# Patient Record
Sex: Male | Born: 2000 | Race: Black or African American | Hispanic: No | Marital: Single | Smoking: Never smoker
Health system: Southern US, Community
[De-identification: ages and names within clinical notes are randomized; demographics above are authoritative.]

## PROBLEM LIST (undated history)

## (undated) HISTORY — PX: HERNIA REPAIR: SHX51

---
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
## 2018-02-09 ENCOUNTER — Encounter (HOSPITAL_COMMUNITY): Payer: Self-pay | Admitting: Emergency Medicine

## 2018-02-09 ENCOUNTER — Other Ambulatory Visit: Payer: Self-pay

## 2018-02-09 ENCOUNTER — Ambulatory Visit (HOSPITAL_COMMUNITY)
Admission: EM | Admit: 2018-02-09 | Discharge: 2018-02-09 | Disposition: A | Discharge status: Home / Self Care | Payer: 59 | Attending: Family Medicine | Admitting: Family Medicine

## 2018-02-09 DIAGNOSIS — R69 Illness, unspecified: Secondary | ICD-10-CM | POA: Diagnosis not present

## 2018-02-09 DIAGNOSIS — J40 Bronchitis, not specified as acute or chronic: Secondary | ICD-10-CM

## 2018-02-09 DIAGNOSIS — J111 Influenza due to unidentified influenza virus with other respiratory manifestations: Secondary | ICD-10-CM

## 2018-02-09 MED ORDER — BENZONATATE 100 MG PO CAPS
100.0000 mg | ORAL_CAPSULE | Freq: Three times a day (TID) | ORAL | 0 refills | Status: AC | PRN
Start: 2018-02-09 — End: ?

## 2018-02-09 MED ORDER — ALBUTEROL SULFATE HFA 108 (90 BASE) MCG/ACT IN AERS: 2.0000 | INHALATION_SPRAY | RESPIRATORY_TRACT | 1 refills | Status: AC | PRN

## 2018-02-09 NOTE — ED Provider Notes (Signed)
  [facility]   [id number] 02/09/18 Arrival Time: 1249   SUBJECTIVE:  Ronnie Murphy is a 17 y.o. male who presents to the urgent care with complaint of coughing, headache, back aches, chills, fatigue since Sunday.   History reviewed. No pertinent past medical history. No family history on file. Social History   Socioeconomic History  . Marital status: Single    Spouse name: Not on file  . Number of children: Not on file  . Years of education: Not on file  . Highest education level: Not on file  Social Needs  . Financial resource strain: Not on file  . Food insecurity - worry: Not on file  . Food insecurity - inability: Not on file  . Transportation needs - medical: Not on file  . Transportation needs - non-medical: Not on file  Occupational History  . Not on file  Tobacco Use  . Smoking status: Never Smoker  Substance and Sexual Activity  . Alcohol use: No  . Drug use: No  . Sexual activity: Not on file  Other Topics Concern  . Not on file  Social History Narrative  . Not on file   No outpatient medications have been marked as taking for the 02/09/18 encounter [facility](Hospital Encounter).   No Known Allergies    ROS: As per HPI, remainder of ROS negative.   OBJECTIVE:   Vitals:   02/09/18 1318  BP: 105/63  Pulse: 78  Resp: 18  Temp: 98.9 F (37.2 C)  SpO2: 100%     General appearance: alert; no distress Eyes: PERRL; EOMI; conjunctiva normal HENT: normocephalic; atraumatic; TMs normal, canal normal, external ears normal without trauma; nasal mucosa normal; oral mucosa normal Neck: supple Lungs: ronchi on auscultation bilaterally Heart: regular rate and rhythm Abdomen: soft, non-tender; bowel sounds normal; no masses or organomegaly; no guarding or rebound tenderness Back: no CVA tenderness Extremities: no cyanosis or edema; symmetrical with no gross deformities Skin: warm and dry Neurologic: normal gait; grossly normal Psychological: alert  and cooperative; normal mood and affect      Labs:  No results found for this or any previous visit.  Labs Reviewed - No data to display  No results found.     ASSESSMENT & PLAN:  1. Bronchitis   2. Influenza-like illness   Return if you are not feeling better by Sunday.  Meds ordered this encounter  Medications  . albuterol (PROVENTIL HFA;VENTOLIN HFA) 108 (90 Base) MCG/ACT inhaler    Sig: Inhale 2 puffs into the lungs every 4 (four) hours as needed for wheezing or shortness of breath (cough, shortness of breath or wheezing.).    Dispense:  1 Inhaler    Refill:  1  . benzonatate (TESSALON) 100 MG capsule    Sig: Take 1-2 capsules (100-200 mg total) by mouth 3 (three) times daily as needed for cough.    Dispense:  40 capsule    Refill:  0    Reviewed expectations re: course of current medical issues. Questions answered. Outlined signs and symptoms indicating need for more acute intervention. Patient verbalized understanding. After Visit Summary given.    Procedures:      Elvina SidleLauenstein, Shantale Holtmeyer, MD 02/09/18 [phone]

## 2018-02-09 NOTE — ED Triage Notes (Signed)
Pt c/o coughing, headache, back aches, chills, fatigue since Sunday.

## 2018-03-07 DIAGNOSIS — Z0000 Encounter for general adult medical examination without abnormal findings: Secondary | ICD-10-CM | POA: Diagnosis not present

## 2018-03-07 DIAGNOSIS — Z13220 Encounter for screening for lipoid disorders: Secondary | ICD-10-CM | POA: Diagnosis not present

## 2018-07-04 ENCOUNTER — Encounter (HOSPITAL_COMMUNITY): Payer: Self-pay | Admitting: Emergency Medicine

## 2018-07-04 ENCOUNTER — Ambulatory Visit (HOSPITAL_COMMUNITY)
Admission: EM | Admit: 2018-07-04 | Discharge: 2018-07-04 | Disposition: A | Discharge status: Home / Self Care | Payer: 59 | Attending: Internal Medicine | Admitting: Internal Medicine

## 2018-07-04 DIAGNOSIS — Z7251 High risk heterosexual behavior: Secondary | ICD-10-CM | POA: Insufficient documentation

## 2018-07-04 DIAGNOSIS — Z113 Encounter for screening for infections with a predominantly sexual mode of transmission: Secondary | ICD-10-CM | POA: Diagnosis not present

## 2018-07-04 NOTE — ED Triage Notes (Signed)
Pt wants to be checked for stds, no symptoms.
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
## 2018-07-04 NOTE — Discharge Instructions (Addendum)
Declines treatment today Urine cytology sent Declines HIV/ syphilis testing today We will follow up with you regarding the results of your test If tests are positive, please abstain from sexual activity for at least 7 days and notify partners Follow up with PCP if symptoms persists Return here or go to ER if you have any new or worsening symptoms

## 2018-07-05 ENCOUNTER — Telehealth (HOSPITAL_COMMUNITY): Payer: Self-pay

## 2018-07-05 LAB — URINE CYTOLOGY ANCILLARY ONLY
Chlamydia: POSITIVE — AB
NEISSERIA GONORRHEA: NEGATIVE
Trichomonas: NEGATIVE

## 2018-07-05 MED ORDER — AZITHROMYCIN 250 MG PO TABS: 1000.0000 mg | ORAL_TABLET | Freq: Every day | ORAL | 0 refills | Status: AC

## 2018-07-05 NOTE — Telephone Encounter (Signed)
Chlamydia is positive.  Rx po zithromax 1g #1 dose no refills was sent to the pharmacy of record.  Pt contacted and made aware, educated to please refrain from sexual intercourse for 7 days to give the medicine time to work, sexual partners need to be notified and tested/treated.  Condoms may reduce risk of reinfection.  Recheck or followup with PCP for further evaluation if symptoms are not improving.   [facility] notified  

## 2019-11-13 ENCOUNTER — Other Ambulatory Visit: Payer: Self-pay

## 2019-11-13 DIAGNOSIS — Z20822 Contact with and (suspected) exposure to covid-19: Secondary | ICD-10-CM

## 2019-11-15 LAB — NOVEL CORONAVIRUS, NAA: SARS-CoV-2, NAA: NOT DETECTED

## 2019-12-03 ENCOUNTER — Encounter (HOSPITAL_COMMUNITY): Payer: Self-pay

## 2019-12-03 ENCOUNTER — Other Ambulatory Visit: Payer: Self-pay

## 2019-12-03 ENCOUNTER — Ambulatory Visit (HOSPITAL_COMMUNITY)
Admission: EM | Admit: 2019-12-03 | Discharge: 2019-12-03 | Disposition: A | Discharge status: Home / Self Care | Payer: 59 | Attending: Family Medicine | Admitting: Family Medicine

## 2019-12-03 DIAGNOSIS — Z202 Contact with and (suspected) exposure to infections with a predominantly sexual mode of transmission: Secondary | ICD-10-CM

## 2019-12-03 DIAGNOSIS — Z113 Encounter for screening for infections with a predominantly sexual mode of transmission: Secondary | ICD-10-CM | POA: Diagnosis not present

## 2019-12-03 DIAGNOSIS — R369 Urethral discharge, unspecified: Secondary | ICD-10-CM | POA: Insufficient documentation

## 2019-12-03 MED ORDER — CEFTRIAXONE SODIUM 250 MG IJ SOLR
250.0000 mg | Freq: Once | INTRAMUSCULAR | Status: AC
  Administered 2019-12-03: 12:00:00 250 mg via INTRAMUSCULAR

## 2019-12-03 MED ORDER — CEFTRIAXONE SODIUM 250 MG IJ SOLR
INTRAMUSCULAR | Status: AC
  Filled 2019-12-03: qty 250

## 2019-12-03 MED ORDER — AZITHROMYCIN 250 MG PO TABS
ORAL_TABLET | ORAL | Status: AC
  Filled 2019-12-03: qty 4

## 2019-12-03 MED ORDER — AZITHROMYCIN 250 MG PO TABS
1000.0000 mg | ORAL_TABLET | Freq: Once | ORAL | Status: AC
  Administered 2019-12-03: 12:00:00 1000 mg via ORAL

## 2019-12-03 NOTE — Discharge Instructions (Addendum)
Treating for gonorrhea and chlamydia.  We will call you with any positive results of your swab
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
## 2019-12-04 LAB — CYTOLOGY, (ORAL, ANAL, URETHRAL) ANCILLARY ONLY
Chlamydia: NEGATIVE
Neisseria Gonorrhea: POSITIVE — AB
Trichomonas: NEGATIVE

## 2019-12-05 ENCOUNTER — Telehealth: Payer: Self-pay | Admitting: Emergency Medicine

## 2019-12-05 NOTE — Telephone Encounter (Signed)
Patient contacted by phone and made aware of  cytology  results. Pt verbalized understanding and had all questions answered.    

## 2019-12-05 NOTE — Telephone Encounter (Signed)
Test for gonorrhea was positive. This was treated at the urgent care visit with IM rocephin 250mg and po zithromax 1g. Pt needs education to refrain from sexual intercourse for 7 days after treatment to give the medicine time to work. Sexual partners need to be notified and tested/treated. Condoms may reduce risk of reinfection. Recheck or followup with PCP for further evaluation if symptoms are not improving. [facility] notified.   Attempted to reach patient. No answer at this time. Voicemail left.
